# Patient Record
Sex: Male | Born: 2005 | Race: Black or African American | Hispanic: No | Marital: Single | Smoking: Never smoker
Health system: Southern US, Community
[De-identification: ages and names within clinical notes are randomized; demographics above are authoritative.]

## PROBLEM LIST (undated history)

## (undated) DIAGNOSIS — Z9621 Cochlear implant status: Secondary | ICD-10-CM

---
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
## 2024-04-13 ENCOUNTER — Ambulatory Visit

## 2024-04-16 ENCOUNTER — Telehealth: Payer: Self-pay | Admitting: Pediatrics

## 2024-04-16 NOTE — Telephone Encounter (Signed)
 Called to rs missed 11/25 appt na lvm
